# Patient Record
Sex: Male | Born: 1975 | Marital: Married | Smoking: Never smoker
Health system: Southern US, Community
[De-identification: ages and names within clinical notes are randomized; demographics above are authoritative.]

## PROBLEM LIST (undated history)

## (undated) DIAGNOSIS — S069X9A Unspecified intracranial injury with loss of consciousness of unspecified duration, initial encounter: Secondary | ICD-10-CM

## (undated) DIAGNOSIS — S069XAA Unspecified intracranial injury with loss of consciousness status unknown, initial encounter: Secondary | ICD-10-CM

## (undated) HISTORY — DX: Unspecified intracranial injury with loss of consciousness of unspecified duration, initial encounter: S06.9X9A

## (undated) HISTORY — DX: Unspecified intracranial injury with loss of consciousness status unknown, initial encounter: S06.9XAA

---

## 2016-11-21 ENCOUNTER — Encounter: Payer: Self-pay | Admitting: Family Medicine

## 2016-11-21 ENCOUNTER — Ambulatory Visit (INDEPENDENT_AMBULATORY_CARE_PROVIDER_SITE_OTHER): Payer: BLUE CROSS/BLUE SHIELD

## 2016-11-21 ENCOUNTER — Ambulatory Visit (INDEPENDENT_AMBULATORY_CARE_PROVIDER_SITE_OTHER): Payer: BLUE CROSS/BLUE SHIELD | Admitting: Family Medicine

## 2016-11-21 DIAGNOSIS — M25559 Pain in unspecified hip: Secondary | ICD-10-CM | POA: Diagnosis not present

## 2016-11-21 DIAGNOSIS — M25551 Pain in right hip: Secondary | ICD-10-CM | POA: Insufficient documentation

## 2016-11-21 DIAGNOSIS — M25552 Pain in left hip: Secondary | ICD-10-CM | POA: Diagnosis not present

## 2016-11-21 MED ORDER — DICLOFENAC SODIUM 1 % TD GEL: 4.0000 g | Freq: Four times a day (QID) | TRANSDERMAL | 11 refills | Status: DC

## 2016-11-21 NOTE — Patient Instructions (Addendum)
Thank you for coming in today. Attend PT.  Apply diclofenac gel up to 4x daily.  Recheck as needed.   I recommend therapy for grief as well.   Let me know how you are doing.   [phone]  Text me.   Duloxetine delayed-release capsules What is this medicine? DULOXETINE (doo LOX e teen) is used to treat depression, anxiety, and different types of chronic pain. This medicine may be used for other purposes; ask your health care provider or pharmacist if you have questions. COMMON BRAND NAME(S): Cymbalta, Irenka What should I tell my health care provider before I take this medicine? They need to know if you have any of these conditions: -bipolar disorder or a family history of bipolar disorder -glaucoma -kidney disease -liver disease -suicidal thoughts or a previous suicide attempt -taken medicines called MAOIs like Carbex, Eldepryl, Marplan, Nardil, and Parnate within 14 days -an unusual reaction to duloxetine, other medicines, foods, dyes, or preservatives -pregnant or trying to get pregnant -breast-feeding How should I use this medicine? Take this medicine by mouth with a glass of water. Follow the directions on the prescription label. Do not cut, crush or chew this medicine. You can take this medicine with or without food. Take your medicine at regular intervals. Do not take your medicine more often than directed. Do not stop taking this medicine suddenly except upon the advice of your doctor. Stopping this medicine too quickly may cause serious side effects or your condition may worsen. A special MedGuide will be given to you by the pharmacist with each prescription and refill. Be sure to read this information carefully each time. Talk to your pediatrician regarding the use of this medicine in children. While this drug may be prescribed for children as young as 127 years of age for selected conditions, precautions do apply. Overdosage: If you think you have taken too much of this  medicine contact a poison control center or emergency room at once. NOTE: This medicine is only for you. Do not share this medicine with others. What if I miss a dose? If you miss a dose, take it as soon as you can. If it is almost time for your next dose, take only that dose. Do not take double or extra doses. What may interact with this medicine? Do not take this medicine with any of the following medications: -desvenlafaxine -levomilnacipran -linezolid -MAOIs like Carbex, Eldepryl, Marplan, Nardil, and Parnate -methylene blue (injected into a vein) -milnacipran -thioridazine -venlafaxine This medicine may also interact with the following medications: -alcohol -amphetamines -aspirin and aspirin-like medicines -certain antibiotics like ciprofloxacin and enoxacin -certain medicines for blood pressure, heart disease, irregular heart beat -certain medicines for depression, anxiety, or psychotic disturbances -certain medicines for migraine headache like almotriptan, eletriptan, frovatriptan, naratriptan, rizatriptan, sumatriptan, zolmitriptan -certain medicines that treat or prevent blood clots like warfarin, enoxaparin, and dalteparin -cimetidine -fentanyl -lithium -NSAIDS, medicines for pain and inflammation, like ibuprofen or naproxen -phentermine -procarbazine -rasagiline -sibutramine -St. John's wort -theophylline -tramadol -tryptophan This list may not describe all possible interactions. Give your health care provider a list of all the medicines, herbs, non-prescription drugs, or dietary supplements you use. Also tell them if you smoke, drink alcohol, or use illegal drugs. Some items may interact with your medicine. What should I watch for while using this medicine? Tell your doctor if your symptoms do not get better or if they get worse. Visit your doctor or health care professional for regular checks on your progress. Because it may take  several weeks to see the full effects  of this medicine, it is important to continue your treatment as prescribed by your doctor. Patients and their families should watch out for new or worsening thoughts of suicide or depression. Also watch out for sudden changes in feelings such as feeling anxious, agitated, panicky, irritable, hostile, aggressive, impulsive, severely restless, overly excited and hyperactive, or not being able to sleep. If this happens, especially at the beginning of treatment or after a change in dose, call your health care professional. Bonita Quin may get drowsy or dizzy. Do not drive, use machinery, or do anything that needs mental alertness until you know how this medicine affects you. Do not stand or sit up quickly, especially if you are an older patient. This reduces the risk of dizzy or fainting spells. Alcohol may interfere with the effect of this medicine. Avoid alcoholic drinks. This medicine can cause an increase in blood pressure. This medicine can also cause a sudden drop in your blood pressure, which may make you feel faint and increase the chance of a fall. These effects are most common when you first start the medicine or when the dose is increased, or during use of other medicines that can cause a sudden drop in blood pressure. Check with your doctor for instructions on monitoring your blood pressure while taking this medicine. Your mouth may get dry. Chewing sugarless gum or sucking hard candy, and drinking plenty of water may help. Contact your doctor if the problem does not go away or is severe. What side effects may I notice from receiving this medicine? Side effects that you should report to your doctor or health care professional as soon as possible: -allergic reactions like skin rash, itching or hives, swelling of the face, lips, or tongue -anxious -breathing problems -confusion -changes in vision -chest pain -confusion -elevated mood, decreased need for sleep, racing thoughts, impulsive behavior -eye  pain -fast, irregular heartbeat -feeling faint or lightheaded, falls -feeling agitated, angry, or irritable -hallucination, loss of contact with reality -high blood pressure -loss of balance or coordination -palpitations -redness, blistering, peeling or loosening of the skin, including inside the mouth -restlessness, pacing, inability to keep still -seizures -stiff muscles -suicidal thoughts or other mood changes -trouble passing urine or change in the amount of urine -trouble sleeping -unusual bleeding or bruising -unusually weak or tired -vomiting -yellowing of the eyes or skin Side effects that usually do not require medical attention (report to your doctor or health care professional if they continue or are bothersome): -change in sex drive or performance -change in appetite or weight -constipation -dizziness -dry mouth -headache -increased sweating -nausea -tired This list may not describe all possible side effects. Call your doctor for medical advice about side effects. You may report side effects to FDA at 1-800-FDA-1088. Where should I keep my medicine? Keep out of the reach of children. Store at room temperature between 20 and 25 degrees C (68 to 77 degrees F). Throw away any unused medicine after the expiration date. NOTE: This sheet is a summary. It may not cover all possible information. If you have questions about this medicine, talk to your doctor, pharmacist, or health care provider.  2018 Elsevier/Gold Standard (2015-06-08 18:16:03)
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
## 2016-12-03 ENCOUNTER — Telehealth: Payer: Self-pay | Admitting: Physical Therapy

## 2016-12-03 NOTE — Telephone Encounter (Signed)
12/03/16 due to deductible, patient does not want to schedule PT

## 2017-01-31 ENCOUNTER — Encounter: Payer: Self-pay | Admitting: Family Medicine

## 2017-01-31 ENCOUNTER — Ambulatory Visit: Payer: BLUE CROSS/BLUE SHIELD | Admitting: Family Medicine

## 2017-01-31 VITALS — BP 123/86 | HR 56 | Wt 191.0 lb

## 2017-01-31 DIAGNOSIS — M7661 Achilles tendinitis, right leg: Secondary | ICD-10-CM | POA: Diagnosis not present

## 2017-01-31 DIAGNOSIS — M9261 Juvenile osteochondrosis of tarsus, right ankle: Secondary | ICD-10-CM

## 2017-01-31 MED ORDER — DICLOFENAC SODIUM 1 % TD GEL: 4.0000 g | Freq: Four times a day (QID) | TRANSDERMAL | 11 refills | Status: AC

## 2017-01-31 NOTE — Patient Instructions (Signed)
Thank you for coming in today. Do the heel exercises.  Remember to go down slowly.  It should take 4 seconds or so Do about 15-30 reps a few times a day.  Use the diclofenac gel.  If it is expensive we can get it for <$24 at [facility].  Use the heel lifts.  Recheck for injection if not better.    Achilles Tendinitis Achilles tendinitis is inflammation of the tough, cord-like band that attaches the lower leg muscles to the heel bone (Achilles tendon). This is usually caused by overusing the tendon and the ankle joint. Achilles tendinitis usually gets better over time with treatment and caring for yourself at home. It can take weeks or months to heal completely. What are the causes? This condition may be caused by:  A sudden increase in exercise or activity, such as running.  Doing the same exercises or activities (such as jumping) over and over.  Not warming up calf muscles before exercising.  Exercising in shoes that are worn out or not made for exercise.  Having arthritis or a bone growth (spur) on the back of the heel bone. This can rub against the tendon and hurt it.  Age-related wear and tear. Tendons become less flexible with age and more likely to be injured.  What are the signs or symptoms? Common symptoms of this condition include:  Pain in the Achilles tendon or in the back of the leg, just above the heel. The pain usually gets worse with exercise.  Stiffness or soreness in the back of the leg, especially in the morning.  Swelling of the skin over the Achilles tendon.  Thickening of the tendon.  Bone spurs at the bottom of the Achilles tendon, near the heel.  Trouble standing on tiptoe.  How is this diagnosed? This condition is diagnosed based on your symptoms and a physical exam. You may have tests, including:  X-rays.  MRI.  How is this treated? The goal of treatment is to relieve symptoms and help your injury heal. Treatment may include:  Decreasing or  stopping activities that caused the tendinitis. This may mean switching to low-impact exercises like biking or swimming.  Icing the injured area.  Doing physical therapy, including strengthening and stretching exercises.  NSAIDs to help relieve pain and swelling.  Using supportive shoes, wraps, heel lifts, or a walking boot (air cast).  Surgery. This may be done if your symptoms do not improve after 6 months.  Using high-energy shock wave impulses to stimulate the healing process (extracorporeal shock wave therapy). This is rare.  Injection of medicines to help relieve inflammation (corticosteroids). This is rare.  Follow these instructions at home: If you have an air cast:  Wear the cast as told by your health care provider. Remove it only as told by your health care provider.  Loosen the cast if your toes tingle, become numb, or turn cold and blue. Activity  Gradually return to your normal activities once your health care provider approves. Do not do activities that cause pain. ? Consider doing low-impact exercises, like cycling or swimming.  If you have an air cast, ask your health care provider when it is safe for you to drive.  If physical therapy was prescribed, do exercises as told by your health care provider or physical therapist. Managing pain, stiffness, and swelling  Raise (elevate) your foot above the level of your heart while you are sitting or lying down.  Move your toes often to avoid stiffness and  to lessen swelling.  If directed, put ice on the injured area: ? Put ice in a plastic bag. ? Place a towel between your skin and the bag. ? Leave the ice on for 20 minutes, 2-3 times a day General instructions  If directed, wrap your foot with an elastic bandage or other wrap. This can help keep your tendon from moving too much while it heals. Your health care provider will show you how to wrap your foot correctly.  Wear supportive shoes or heel lifts only as  told by your health care provider.  Take over-the-counter and prescription medicines only as told by your health care provider.  Keep all follow-up visits as told by your health care provider. This is important. Contact a health care provider if:  You have symptoms that gets worse.  You have pain that does not get better with medicine.  You develop new, unexplained symptoms.  You develop warmth and swelling in your foot.  You have a fever. Get help right away if:  You have a sudden popping sound or sensation in your Achilles tendon followed by severe pain.  You cannot move your toes or foot.  You cannot put any weight on your foot. Summary  Achilles tendinitis is inflammation of the tough, cord-like band that attaches the lower leg muscles to the heel bone (Achilles tendon).  This condition is usually caused by overusing the tendon and the ankle joint. It can also be caused by arthritis or normal aging.  The most common symptoms of this condition include pain, swelling, or stiffness in the Achilles tendon or in the back of the leg.  This condition is usually treated with rest, NSAIDs, and physical therapy. This information is not intended to replace advice given to you by your health care provider. Make sure you discuss any questions you have with your health care provider. Document Released: 10/17/2004 Document Revised: 11/27/2015 Document Reviewed: 11/27/2015 Elsevier Interactive Patient Education  2017 [employer]

## 2017-02-01 ENCOUNTER — Encounter: Payer: Self-pay | Admitting: Family Medicine

## 2017-02-01 DIAGNOSIS — M9261 Juvenile osteochondrosis of tarsus, right ankle: Secondary | ICD-10-CM | POA: Insufficient documentation

## 2017-02-01 DIAGNOSIS — M7661 Achilles tendinitis, right leg: Secondary | ICD-10-CM | POA: Insufficient documentation
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
## 2017-02-10 ENCOUNTER — Ambulatory Visit: Payer: BLUE CROSS/BLUE SHIELD | Admitting: Family Medicine

## 2017-02-10 DIAGNOSIS — R42 Dizziness and giddiness: Secondary | ICD-10-CM | POA: Diagnosis not present
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
## 2017-02-10 NOTE — Patient Instructions (Signed)
Thank you for coming in today. Attend Vestibular PT.  If not getting better next step is MRI vs CT scan.  Recheck if not better.  You should hear from Neuro Rehab PT soon.  If you dont hear anything let me know.    Benign Positional Vertigo Vertigo is the feeling that you or your surroundings are moving when they are not. Benign positional vertigo is the most common form of vertigo. The cause of this condition is not serious (is benign). This condition is triggered by certain movements and positions (is positional). This condition can be dangerous if it occurs while you are doing something that could endanger you or others, such as driving. What are the causes? In many cases, the cause of this condition is not known. It may be caused by a disturbance in an area of the inner ear that helps your brain to sense movement and balance. This disturbance can be caused by a viral infection (labyrinthitis), head injury, or repetitive motion. What increases the risk? This condition is more likely to develop in:  Women.  People who are 44 years of age or older.  What are the signs or symptoms? Symptoms of this condition usually happen when you move your head or your eyes in different directions. Symptoms may start suddenly, and they usually last for less than a minute. Symptoms may include:  Loss of balance and falling.  Feeling like you are spinning or moving.  Feeling like your surroundings are spinning or moving.  Nausea and vomiting.  Blurred vision.  Dizziness.  Involuntary eye movement (nystagmus).  Symptoms can be mild and cause only slight annoyance, or they can be severe and interfere with daily life. Episodes of benign positional vertigo may return (recur) over time, and they may be triggered by certain movements. Symptoms may improve over time. How is this diagnosed? This condition is usually diagnosed by medical history and a physical exam of the head, neck, and ears. You may be  referred to a health care provider who specializes in ear, nose, and throat (ENT) problems (otolaryngologist) or a provider who specializes in disorders of the nervous system (neurologist). You may have additional testing, including:  MRI.  A CT scan.  Eye movement tests. Your health care provider may ask you to change positions quickly while he or she watches you for symptoms of benign positional vertigo, such as nystagmus. Eye movement may be tested with an electronystagmogram (ENG), caloric stimulation, the Dix-Hallpike test, or the roll test.  An electroencephalogram (EEG). This records electrical activity in your brain.  Hearing tests.  How is this treated? Usually, your health care provider will treat this by moving your head in specific positions to adjust your inner ear back to normal. Surgery may be needed in severe cases, but this is rare. In some cases, benign positional vertigo may resolve on its own in 2-4 weeks. Follow these instructions at home: Safety  Move slowly.Avoid sudden body or head movements.  Avoid driving.  Avoid operating heavy machinery.  Avoid doing any tasks that would be dangerous to you or others if a vertigo episode would occur.  If you have trouble walking or keeping your balance, try using a cane for stability. If you feel dizzy or unstable, sit down right away.  Return to your normal activities as told by your health care provider. Ask your health care provider what activities are safe for you. General instructions  Take over-the-counter and prescription medicines only as told by your  health care provider.  Avoid certain positions or movements as told by your health care provider.  Drink enough fluid to keep your urine clear or pale yellow.  Keep all follow-up visits as told by your health care provider. This is important. Contact a health care provider if:  You have a fever.  Your condition gets worse or you develop new symptoms.  Your  family or friends notice any behavioral changes.  Your nausea or vomiting gets worse.  You have numbness or a "pins and needles" sensation. Get help right away if:  You have difficulty speaking or moving.  You are always dizzy.  You faint.  You develop severe headaches.  You have weakness in your legs or arms.  You have changes in your hearing or vision.  You develop a stiff neck.  You develop sensitivity to light. This information is not intended to replace advice given to you by your health care provider. Make sure you discuss any questions you have with your health care provider. Document Released: 10/15/2005 Document Revised: 06/15/2015 Document Reviewed: 05/02/2014 Elsevier Interactive Patient Education  [employer].

## 2017-04-08 ENCOUNTER — Ambulatory Visit: Payer: BLUE CROSS/BLUE SHIELD | Admitting: Family Medicine

## 2017-04-10 ENCOUNTER — Encounter: Payer: Self-pay | Admitting: Family Medicine

## 2017-04-10 ENCOUNTER — Ambulatory Visit: Payer: BLUE CROSS/BLUE SHIELD | Admitting: Family Medicine

## 2017-04-10 VITALS — BP 119/69 | HR 51 | Wt 194.0 lb

## 2017-04-10 DIAGNOSIS — M7581 Other shoulder lesions, right shoulder: Secondary | ICD-10-CM | POA: Diagnosis not present

## 2017-04-10 DIAGNOSIS — M9261 Juvenile osteochondrosis of tarsus, right ankle: Secondary | ICD-10-CM

## 2017-04-10 DIAGNOSIS — M7661 Achilles tendinitis, right leg: Secondary | ICD-10-CM

## 2017-04-10 MED ORDER — NITROGLYCERIN 0.2 MG/HR TD PT24: MEDICATED_PATCH | TRANSDERMAL | 1 refills | Status: AC

## 2017-04-10 NOTE — Progress Notes (Signed)
Aaron Bush is a 42 y.o. male who presents to [facility] today for right heel and right shoulder pain.   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  He was last seen for this in January 2019.  He had a trial home exercise program and diclofenac gel.  He does this really did not help all that much.  He walked a little bit more inclined some ladders recently and notes that his pain is gotten a lot worse.  The pain can be severe at times it interferes with walking.  He is unable to wear shoes with a heel cup.  He denies any new injury.  Additionally Aaron Bush notes some mild right shoulder pain.  He notes this pain is located in the left lateral upper arm is worse with overhead motion reaching back.  Pain was quite bad a few days ago but has almost completely resolved.  He brings it up only because he is here for his heel.  He denies any injury or radiating pain weakness or numbness.   Past Medical History:  Diagnosis Date  . Brain injury (HCC)    No past surgical history on file. Social History   Tobacco Use  . Smoking status: Never Smoker  . Smokeless tobacco: Never Used  Substance Use Topics  . Alcohol use: Not on file     ROS:  As above   Medications: Current Outpatient Medications  Medication Sig Dispense Refill  . diclofenac sodium (VOLTAREN) 1 % GEL Apply 4 g topically 4 (four) times daily. To affected joint. 100 g 11  . nitroGLYCERIN (NITRODUR - DOSED IN MG/24 HR) 0.2 mg/hr patch Apply 1/4 patch to right achillis tendon daily for tendonitis 30 patch 1   No current facility-administered medications for this visit.    No Known Allergies   Exam:  BP 119/69   Pulse (!) 51   Wt 194 lb (88 kg)  General: Well Developed, well nourished, and in no acute distress.  Neuro/Psych: Alert and oriented x3, extra-ocular muscles intact, able to move all 4  extremities, sensation grossly intact. Skin: Warm and dry, no rashes noted.  Respiratory: Not using accessory muscles, speaking in full sentences, trachea midline.  Cardiovascular: Pulses palpable, no extremity edema. Abdomen: Does not appear distended. MSK:  Right shoulder normal-appearing  nontender  normal motion  normal strength  mildly positive Hawkins and Neer's test. Negative empty can test.   Negative Yergason's and speeds test.  Right heel: Palpable and visible swelling at the posterior calcaneus at the insertion of the Achilles tendon.  Patient with mild erythema without induration or fluctuance. Pain with resisted foot plantarflexion. Ankle range of motion is intact.  Limited musculoskeletal ultrasound of the right Achilles tendon notes a normal appearing Achilles tendon just proximal to the insertion.  The tendon develops areas of hyperechoic change and increased vascularity consistent with Haglund deformity. The soft tissue superficial to the tendon is thickened with areas of hypoechoic change and increased vascularity. Deep to the Achilles tendon there is a small retrocalcaneal bursa measuring less than 1 cm. Normal bony structures.     No results found for this or any previous visit (from the past 48 hour(s)). No results found.    Assessment and Plan: 42 y.o. male with  Right Achilles tendinitis and Haglund deformity.  Not improved with early typical conservative management. We had a lengthy discussion about his treatment  options.  Plan for trial of nitroglycerin prior to protocol and dedicated home exercise program. Next step if not better would either be trial of steroid injection in the retrocalcaneal bursa or possibly PRP.   Right Shoulder: Doing better.  Likely some mild rotator cuff tendinitis.  We discussed home exercise program with exercise bands.  Recheck in 6 weeks.   No orders of the defined types were placed in this encounter.  Meds ordered  this encounter  Medications  . nitroGLYCERIN (NITRODUR - DOSED IN MG/24 HR) 0.2 mg/hr patch    Sig: Apply 1/4 patch to right achillis tendon daily for tendonitis    Dispense:  30 patch    Refill:  1    Discussed warning signs or symptoms. Please see discharge instructions. Patient expresses understanding.

## 2017-04-10 NOTE — Patient Instructions (Signed)
Thank you for coming in today. Continue exercises Remember to go down slowly Try to get 30 reps 1-3x dialy.   Nitroglycerin Protocol   Apply 1/4 nitroglycerin patch to affected area daily.  Change position of patch within the affected area every 24 hours.  You may experience a headache during the first 1-2 weeks of using the patch, these should subside.  If you experience headaches after beginning nitroglycerin patch treatment, you may take your preferred over the counter pain reliever.  Another side effect of the nitroglycerin patch is skin irritation or rash related to patch adhesive.  Please notify our office if you develop more severe headaches or rash, and stop the patch.  Tendon healing with nitroglycerin patch may require 12 to 24 weeks depending on the extent of injury.  Men should not use if taking Viagra, Cialis, or Levitra.   Do not use if you have migraines or rosacea.   Use a cam walker boot as needed.  Do not drive with the boot.  Recheck in about 6 weeks.  Return sooner if worsening.

## 2017-05-22 ENCOUNTER — Ambulatory Visit: Payer: BLUE CROSS/BLUE SHIELD | Admitting: Family Medicine

## 2017-05-23 ENCOUNTER — Ambulatory Visit: Payer: BLUE CROSS/BLUE SHIELD | Admitting: Family Medicine

## 2019-01-15 ENCOUNTER — Other Ambulatory Visit: Payer: Self-pay

## 2019-01-15 ENCOUNTER — Emergency Department (HOSPITAL_BASED_OUTPATIENT_CLINIC_OR_DEPARTMENT_OTHER): Payer: Managed Care, Other (non HMO)

## 2019-01-15 ENCOUNTER — Emergency Department (HOSPITAL_BASED_OUTPATIENT_CLINIC_OR_DEPARTMENT_OTHER)
Admission: EM | Admit: 2019-01-15 | Discharge: 2019-01-16 | Disposition: A | Discharge status: Home / Self Care | Payer: Managed Care, Other (non HMO) | Attending: Emergency Medicine | Admitting: Emergency Medicine

## 2019-01-15 ENCOUNTER — Encounter (HOSPITAL_BASED_OUTPATIENT_CLINIC_OR_DEPARTMENT_OTHER): Payer: Self-pay | Admitting: *Deleted

## 2019-01-15 DIAGNOSIS — S53105A Unspecified dislocation of left ulnohumeral joint, initial encounter: Secondary | ICD-10-CM

## 2019-01-15 DIAGNOSIS — W19XXXA Unspecified fall, initial encounter: Secondary | ICD-10-CM | POA: Diagnosis not present

## 2019-01-15 DIAGNOSIS — S59902A Unspecified injury of left elbow, initial encounter: Secondary | ICD-10-CM | POA: Diagnosis present

## 2019-01-15 DIAGNOSIS — Y92008 Other place in unspecified non-institutional (private) residence as the place of occurrence of the external cause: Secondary | ICD-10-CM | POA: Insufficient documentation

## 2019-01-15 DIAGNOSIS — Y9389 Activity, other specified: Secondary | ICD-10-CM | POA: Diagnosis not present

## 2019-01-15 DIAGNOSIS — Y999 Unspecified external cause status: Secondary | ICD-10-CM | POA: Diagnosis not present

## 2019-01-15 DIAGNOSIS — S53125A Posterior dislocation of left ulnohumeral joint, initial encounter: Secondary | ICD-10-CM | POA: Insufficient documentation

## 2019-01-15 NOTE — ED Triage Notes (Signed)
Pt reports falling after drinking too much today. Landed on left elbow-obvious deformity noted. Pulse and cap refill intact.

## 2019-01-16 ENCOUNTER — Emergency Department (HOSPITAL_BASED_OUTPATIENT_CLINIC_OR_DEPARTMENT_OTHER): Payer: Managed Care, Other (non HMO)

## 2019-01-16 MED ORDER — HYDROCODONE-ACETAMINOPHEN 5-325 MG PO TABS: 2.0000 | ORAL_TABLET | ORAL | 0 refills | Status: AC | PRN

## 2019-01-16 MED ORDER — PROPOFOL 500 MG/50ML IV EMUL
INTRAVENOUS | Status: AC | PRN
  Administered 2019-01-16 (×2): 40 mg via INTRAVENOUS
  Administered 2019-01-16: 42 mg via INTRAVENOUS

## 2019-01-16 MED ORDER — PROPOFOL 10 MG/ML IV BOLUS
0.5000 mg/kg | Freq: Once | INTRAVENOUS | Status: AC
  Administered 2019-01-16: 42 mg via INTRAVENOUS
  Filled 2019-01-16: qty 20

## 2019-01-16 NOTE — ED Provider Notes (Signed)
[facility] EMER[facility]ENC[facility] DEPARTMENT Provider Note   CSN: [id number] Arrival date & time: 01/15/19  2326     History Chief Complaint  Patient presents with  . Fall    Aaron Bush is a 43 y.o. male.  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  He presents today for evaluation of a fall.  Patient was apparently intoxicated and fell in the living room, injuring his left elbow.  He denies numbness or tingling.  He denies other injury.  The history is provided by the patient.  Fall This is a new problem. The current episode started less than 1 hour ago. The problem occurs constantly. The problem has not changed since onset.Exacerbated by: Movement and palpation. Nothing relieves the symptoms. He has tried nothing for the symptoms.       Past Medical History:  Diagnosis Date  . Brain injury [facility])     Patient Active Problem List   Diagnosis Date Noted  . Rotator cuff tendonitis, right 04/10/2017  . Dizzy 02/10/2017  . Achilles tendinitis of right lower extremity 02/01/2017  . Haglund's deformity of right heel 02/01/2017  . Hip pain, bilateral 11/21/2016    History reviewed. No pertinent surgical history.     History reviewed. No pertinent family history.  Social History   Tobacco Use  . Smoking status: Never Smoker  . Smokeless tobacco: Never Used  Substance Use Topics  . Alcohol use: Yes  . Drug use: Yes    Types: Marijuana    Home Medications Prior to Admission medications   Medication Sig Start Date End Date Taking? Authorizing Provider  diclofenac sodium (VOLTAREN) 1 % GEL Apply 4 g topically 4 (four) times daily. To affected joint. 01/31/17   Gregor Hams, MD  nitroGLYCERIN (NITRODUR - DOSED IN MG/24 HR) 0.2 mg/hr patch Apply 1/4 patch to right achillis tendon daily for tendonitis 04/10/17   Gregor Hams, MD    Allergies    Patient has no known allergies.  Review of  Systems   Review of Systems  All other systems reviewed and are negative.   Physical Exam Updated Vital Signs BP (!) 144/77 (BP Location: Left Arm)   Pulse 83   Temp 98.7 F (37.1 C) (Oral)   Resp 18   Ht 5' 11.5" (1.816 m)   Wt 83.9 kg   SpO2 100%   BMI 25.44 kg/m   Physical Exam Vitals and nursing note reviewed.  Constitutional:      General: He is not in acute distress.    Appearance: Normal appearance. He is not ill-appearing.     Comments: Patient is awake and alert.  Speech is somewhat slurred and he does appear intoxicated.  HENT:     Head: Normocephalic and atraumatic.  Pulmonary:     Effort: Pulmonary effort is normal.  Musculoskeletal:     Comments: There is obvious deformity of the left elbow.  He has pain with any range of motion.  Ulnar and radial pulses are easily palpable and motor and sensation are intact to all fingers.  Skin:    General: Skin is warm and dry.  Neurological:     Mental Status: He is alert and oriented to person, place, and time.     ED Results / Procedures / Treatments   Labs (all labs ordered are listed, but only abnormal results are displayed) Labs Reviewed - No data to display  EKG None  Radiology No results found.  Procedures .Sedation  Date/Time: 01/16/2019 1:42 AM Performed by: Geoffery Lyons, MD Authorized by: Geoffery Lyons, MD   Consent:    Consent obtained:  Verbal   Consent given by:  Patient   Risks discussed:  Allergic reaction, dysrhythmia, inadequate sedation, nausea, prolonged hypoxia resulting in organ damage, prolonged sedation necessitating reversal, respiratory compromise necessitating ventilatory assistance and intubation and vomiting   Alternatives discussed:  Analgesia without sedation, anxiolysis and regional anesthesia Universal protocol:    Procedure explained and questions answered to patient or proxy's satisfaction: yes     Relevant documents present and verified: yes     Test results available  and properly labeled: yes     Imaging studies available: yes     Required blood products, implants, devices, and special equipment available: yes     Site/side marked: yes     Immediately prior to procedure a time out was called: yes     Patient identity confirmation method:  Verbally with patient Indications:    Procedure necessitating sedation performed by:  Physician performing sedation Pre-sedation assessment:    Time since last food or drink:  2 hours   ASA classification: class 1 - normal, healthy patient     Neck mobility: normal     Mouth opening:  3 or more finger widths   Thyromental distance:  4 finger widths   Mallampati score:  I - soft palate, uvula, fauces, pillars visible   Pre-sedation assessments completed and reviewed: airway patency, cardiovascular function, hydration status, mental status, nausea/vomiting, pain level, respiratory function and temperature   Immediate pre-procedure details:    Reassessment: Patient reassessed immediately prior to procedure     Reviewed: vital signs, relevant labs/tests and NPO status     Verified: bag valve mask available, emergency equipment available, intubation equipment available, IV patency confirmed, oxygen available and suction available   Procedure details (see MAR for exact dosages):    Preoxygenation:  Nasal cannula   Sedation:  Propofol   Intended level of sedation: deep   Intra-procedure monitoring:  Blood pressure monitoring, cardiac monitor, continuous pulse oximetry, frequent LOC assessments, frequent vital sign checks and continuous capnometry   Intra-procedure events: none     Total Provider sedation time (minutes):  15 Post-procedure details:    Attendance: Constant attendance by certified staff until patient recovered     Recovery: Patient returned to pre-procedure baseline     Post-sedation assessments completed and reviewed: airway patency, cardiovascular function, hydration status, mental status, nausea/vomiting,  pain level, respiratory function and temperature     Patient is stable for discharge or admission: yes     Patient tolerance:  Tolerated well, no immediate complications Comments:     Patient tolerated conscious sedation well with no complications.  He is recovered and is back to his baseline. Reduction of dislocation  Date/Time: 01/16/2019 1:44 AM Performed by: Geoffery Lyons, MD Authorized by: Geoffery Lyons, MD  Consent: Verbal consent obtained. Written consent obtained. Risks and benefits: risks, benefits and alternatives were discussed Consent given by: patient Patient understanding: patient states understanding of the procedure being performed Patient consent: the patient's understanding of the procedure matches consent given Procedure consent: procedure consent matches procedure scheduled Relevant documents: relevant documents present and verified Test results: test results available and properly labeled Site marked: the operative site was marked Imaging studies: imaging studies available Patient identity confirmed: verbally with patient and arm band Time out: Immediately prior to procedure a "time out" was called  to verify the correct patient, procedure, equipment, support staff and site/side marked as required. Preparation: Patient was prepped and draped in the usual sterile fashion. Local anesthesia used: no  Anesthesia: Local anesthesia used: no  Sedation: Patient sedated: yes Sedatives: propofol  Patient tolerance: patient tolerated the procedure well with no immediate complications Comments: Elbow joint was successfully reduced using traction and manipulation of the olecranon.    (including critical care time)  Medications Ordered in ED Medications  propofol (DIPRIVAN) 10 mg/mL bolus/IV push 42 mg (has no administration in time range)    ED Course  I have reviewed the triage vital signs and the nursing notes.  Pertinent labs & imaging results that were  available during my care of the patient were reviewed by me and considered in my medical decision making (see chart for details).    MDM Rules/Calculators/A&P  Patient presenting here with complaints of left elbow pain.  This resulted from a fall while intoxicated.  His x-rays show a posterior dislocation of the left elbow joint.  This was reduced under conscious sedation using propofol.  Patient tolerated the procedure well.  Successful reduction was confirmed by post reduction films.  Patient placed in an arm sling and is to follow-up with primary doctor.  Patient pulses good motor/sensory intact both pre and post reduction.  Final Clinical Impression(s) / ED Diagnoses Final diagnoses:  None    Rx / DC Orders ED Discharge Orders    None       Geoffery Lyonselo, Troyce Gieske, MD 01/16/19 0145

## 2019-01-16 NOTE — Sedation Documentation (Signed)
Xray at bedside for post reduction imaging.

## 2019-01-16 NOTE — Discharge Instructions (Signed)
Wear arm sling as applied for the next several days, then slowly and carefully begin to reintroduce activity.  Ice for 20 minutes every 2 hours while awake for the next 2 days.  Hydrocodone as prescribed as needed for pain.  Follow-up with primary doctor if pain not improving in the next week.

## 2019-01-16 NOTE — Sedation Documentation (Signed)
Pt unable to voice pain at this time.

## 2019-01-16 NOTE — Sedation Documentation (Signed)
Pt rates pain 7/10 to L elbow

## 2019-01-16 NOTE — ED Notes (Signed)
Pt given water for PO challenge 

## 2019-01-16 NOTE — ED Notes (Signed)
Pt tolerated water. Denies nausea/vomiting.

## 2019-01-17 NOTE — Sedation Documentation (Signed)
Pt unable to voice pain at this time.  

## 2020-08-23 IMAGING — DX DG ELBOW COMPLETE 3+V*L*
4 series · 4 of 4 positions shown · non-contrast
Comparison: None.

CLINICAL DATA: Fall deformity

EXAM:
LEFT ELBOW - COMPLETE 3+ VIEW

[elbow ap]
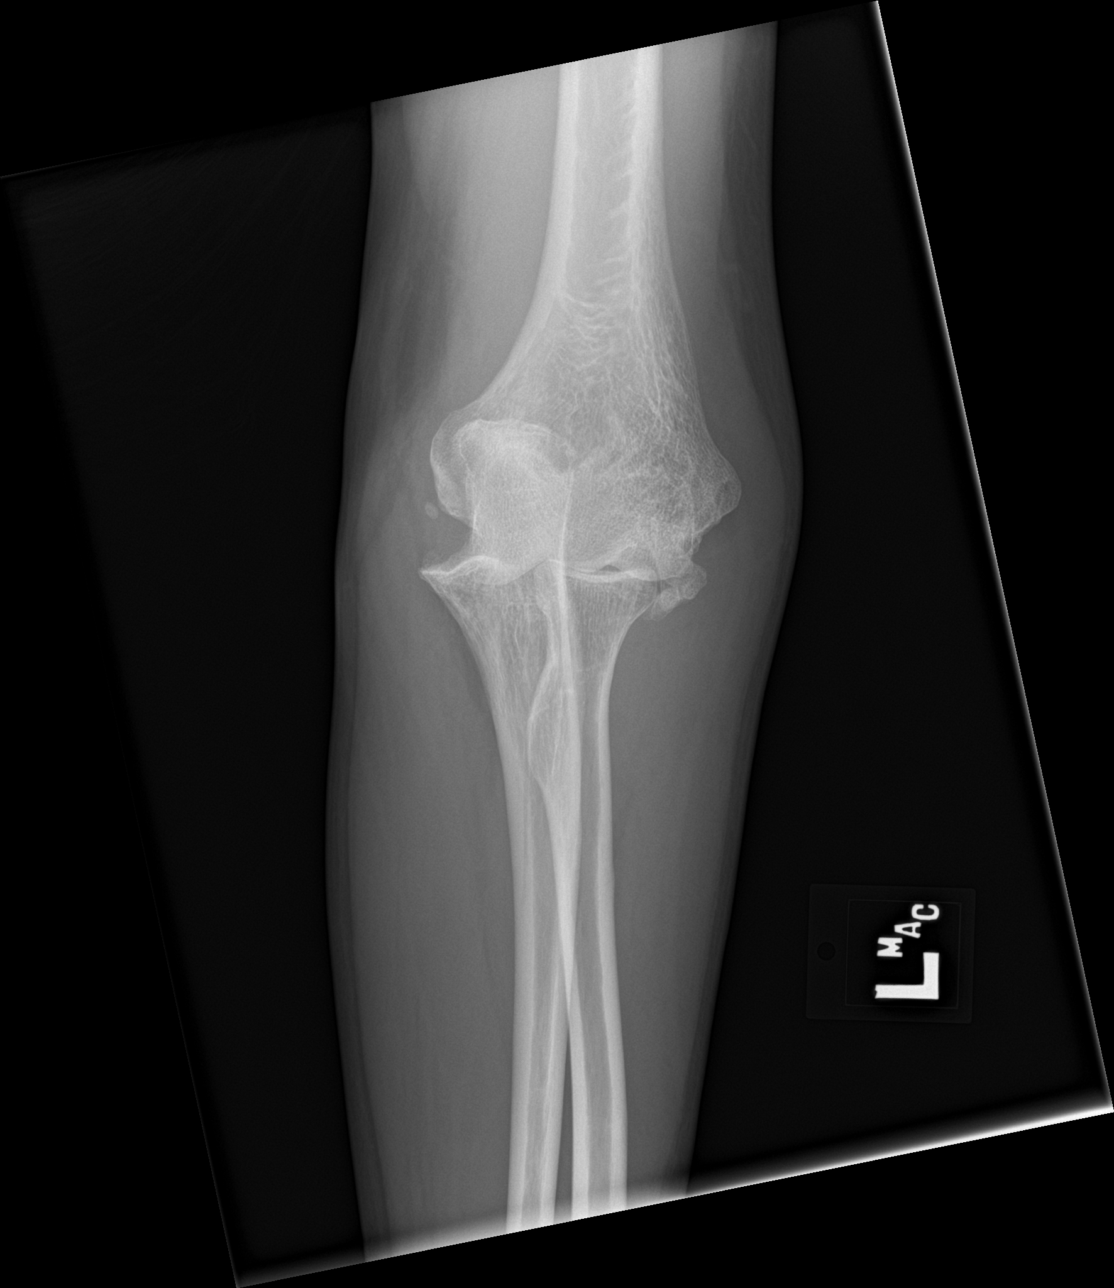

[elbow obl (1 of 2)]
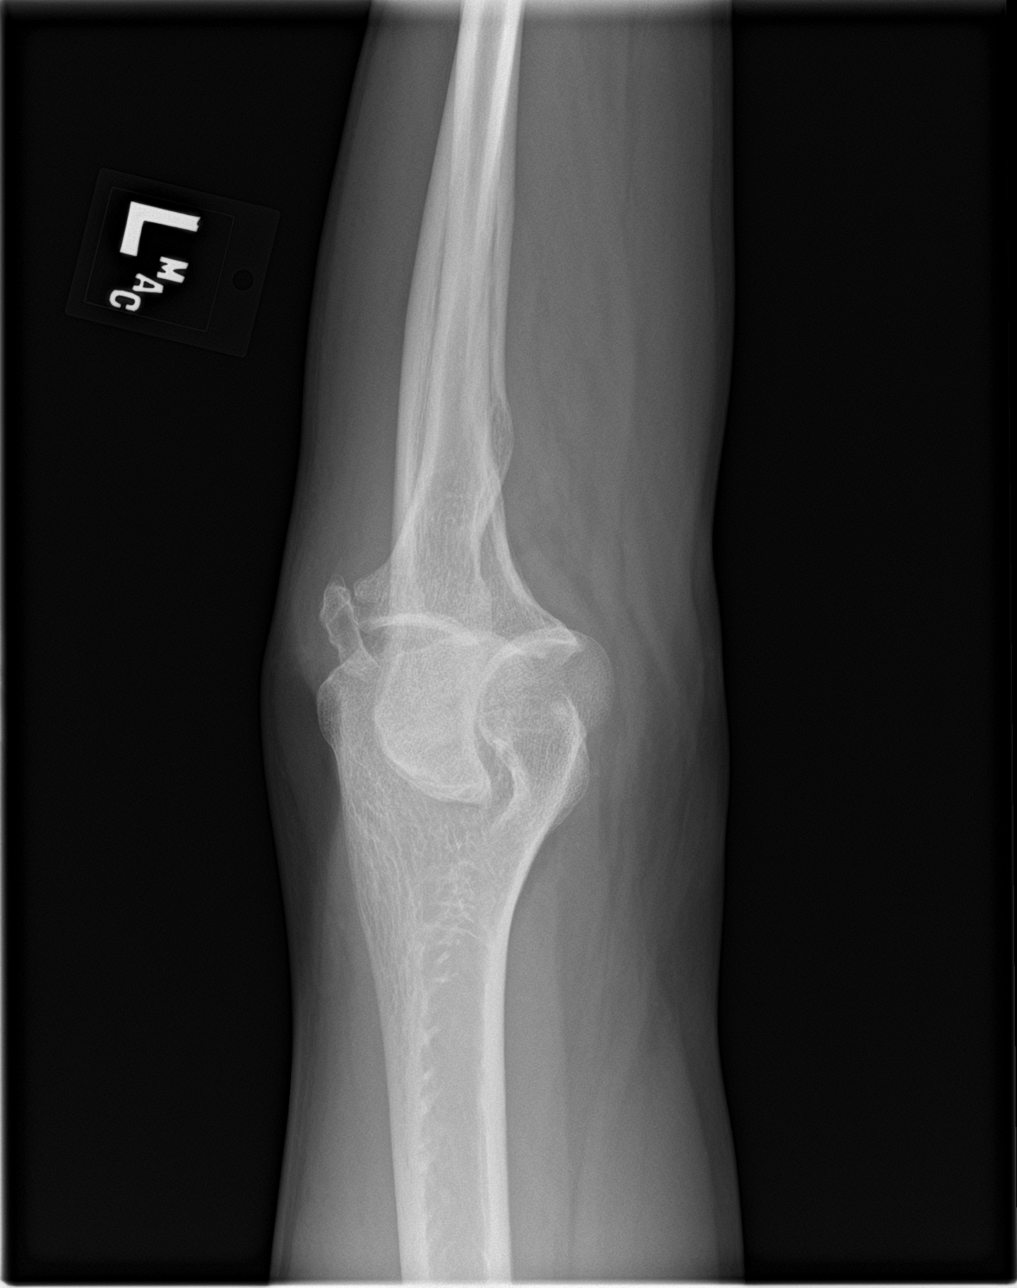

[elbow obl (2 of 2)]
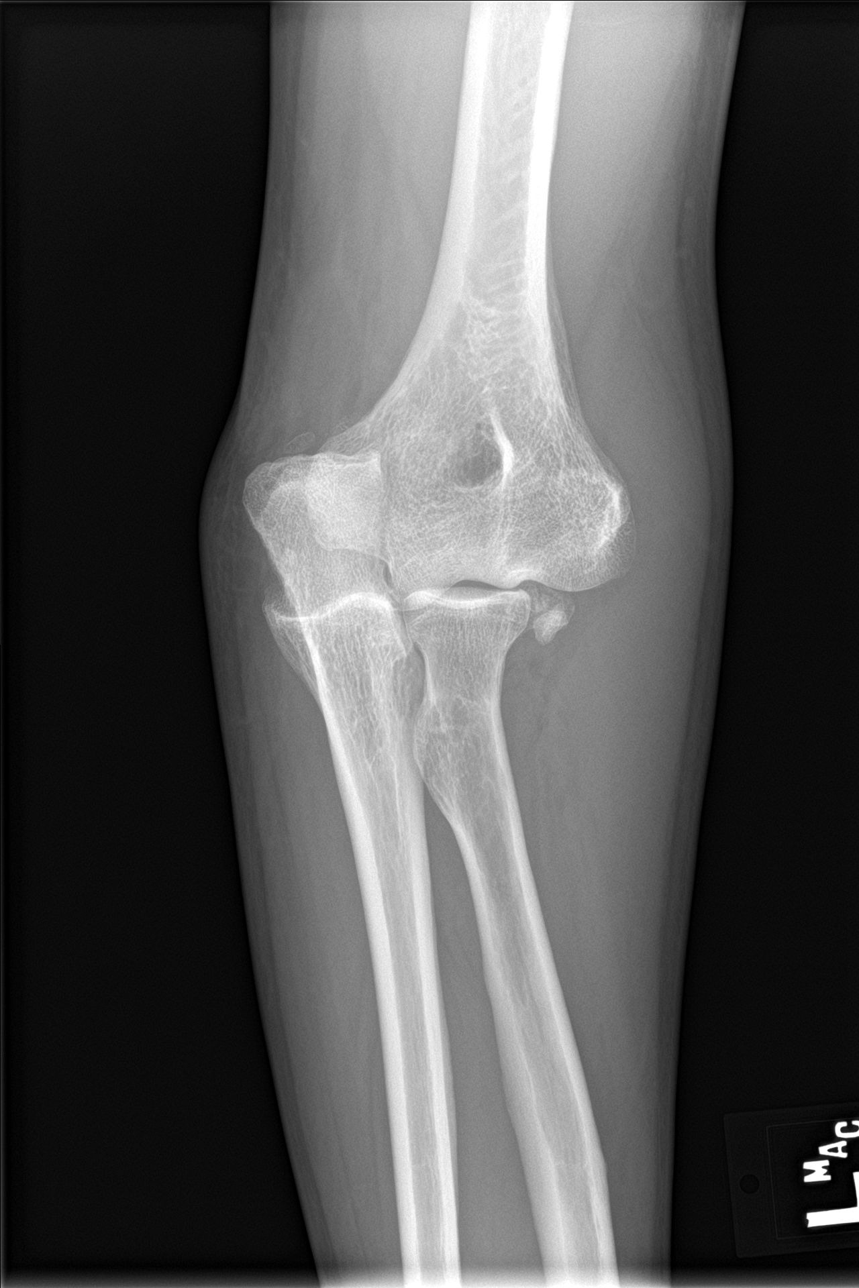

[elbow lat]
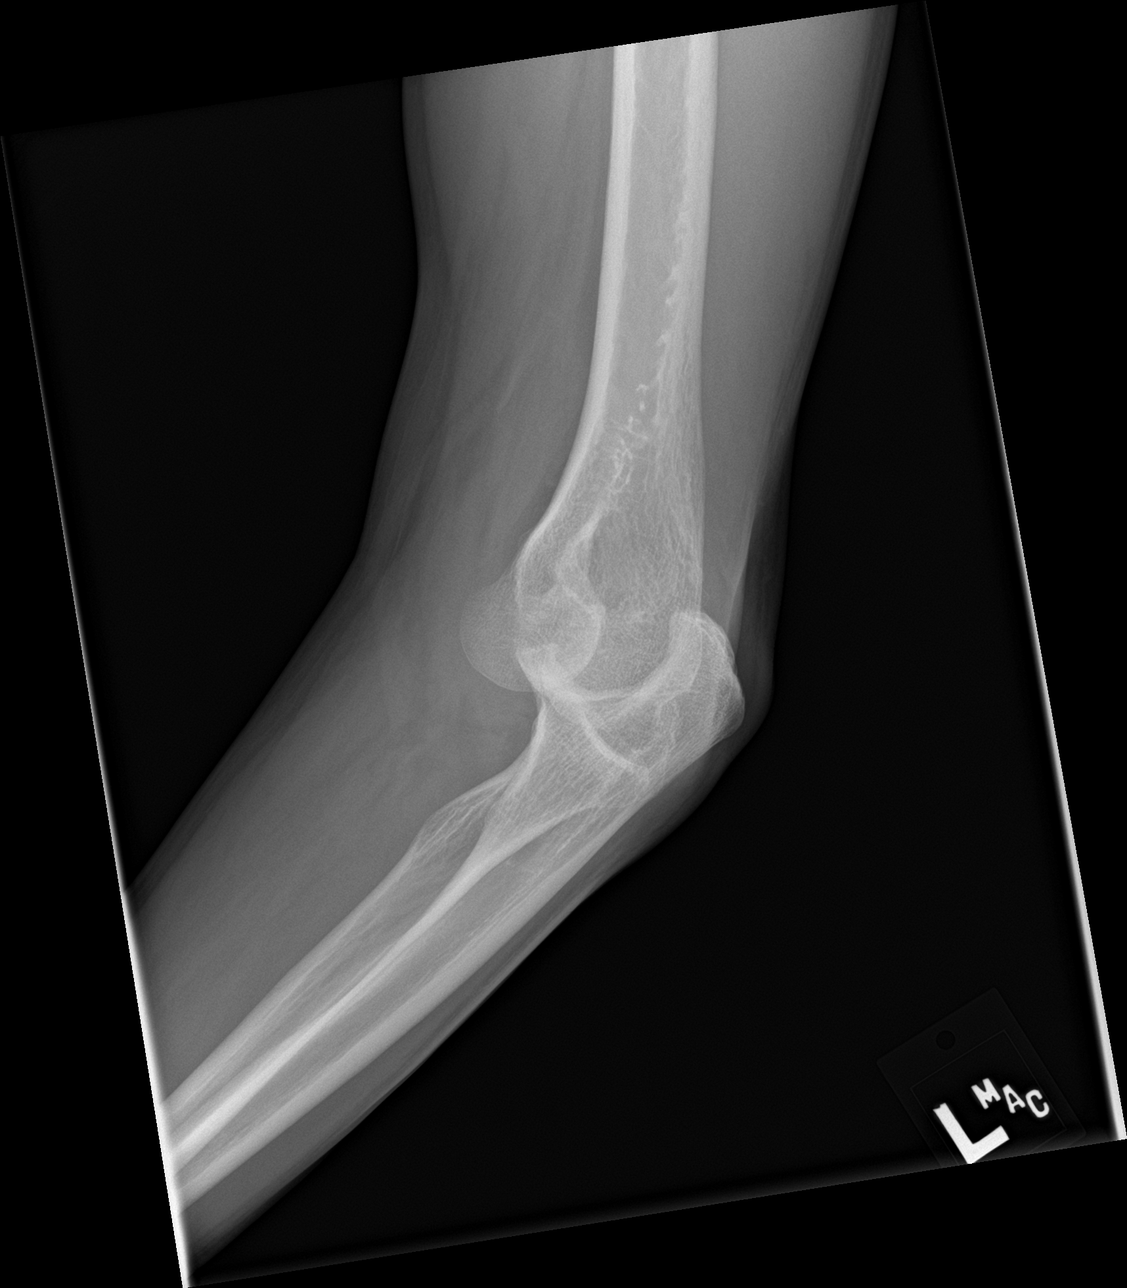

[4 of 4 positions shown; findings below may reference images not displayed]

FINDINGS: There is posterior subluxation of the radius and ulna at the elbow.
There is well corticated ossicle seen adjacent to the radial head
and olecranon process, likely from prior injury. There is diffuse
soft tissue swelling.
IMPRESSION: Posterior subluxation of the radius and ulna.
